# Patient Record
Sex: Female | Born: 1955 | Race: White | Hispanic: No | Smoking: Never smoker
Health system: Southern US, Community
[De-identification: ages and names within clinical notes are randomized; demographics above are authoritative.]

## PROBLEM LIST (undated history)

## (undated) DIAGNOSIS — K859 Acute pancreatitis without necrosis or infection, unspecified: Secondary | ICD-10-CM

## (undated) DIAGNOSIS — K589 Irritable bowel syndrome without diarrhea: Secondary | ICD-10-CM

## (undated) HISTORY — PX: TUBAL LIGATION: SHX77

## (undated) HISTORY — DX: Irritable bowel syndrome without diarrhea: K58.9

## (undated) HISTORY — DX: Acute pancreatitis without necrosis or infection, unspecified: K85.90

---

## 2003-02-07 ENCOUNTER — Ambulatory Visit (HOSPITAL_COMMUNITY): Admission: RE | Admit: 2003-02-07 | Discharge: 2003-02-07 | Payer: Self-pay | Admitting: Obstetrics & Gynecology

## 2007-08-02 ENCOUNTER — Other Ambulatory Visit: Admission: RE | Admit: 2007-08-02 | Discharge: 2007-08-02 | Payer: Self-pay | Admitting: Obstetrics & Gynecology

## 2007-08-03 ENCOUNTER — Ambulatory Visit (HOSPITAL_COMMUNITY): Admission: RE | Admit: 2007-08-03 | Discharge: 2007-08-03 | Payer: Self-pay | Admitting: Obstetrics & Gynecology

## 2010-11-27 ENCOUNTER — Other Ambulatory Visit (HOSPITAL_COMMUNITY)
Admission: RE | Admit: 2010-11-27 | Discharge: 2010-11-27 | Disposition: A | Discharge status: Home / Self Care | Payer: PRIVATE HEALTH INSURANCE | Source: Ambulatory Visit | Attending: Obstetrics & Gynecology | Admitting: Obstetrics & Gynecology

## 2010-11-27 DIAGNOSIS — Z01419 Encounter for gynecological examination (general) (routine) without abnormal findings: Secondary | ICD-10-CM | POA: Insufficient documentation

## 2010-11-28 ENCOUNTER — Other Ambulatory Visit: Payer: Self-pay | Admitting: Obstetrics & Gynecology

## 2010-11-28 DIAGNOSIS — Z139 Encounter for screening, unspecified: Secondary | ICD-10-CM

## 2010-12-05 ENCOUNTER — Ambulatory Visit (HOSPITAL_COMMUNITY)
Admission: RE | Admit: 2010-12-05 | Discharge: 2010-12-05 | Disposition: A | Discharge status: Home / Self Care | Payer: PRIVATE HEALTH INSURANCE | Source: Ambulatory Visit | Attending: Obstetrics & Gynecology | Admitting: Obstetrics & Gynecology

## 2010-12-05 DIAGNOSIS — Z1231 Encounter for screening mammogram for malignant neoplasm of breast: Secondary | ICD-10-CM | POA: Insufficient documentation

## 2010-12-05 DIAGNOSIS — Z139 Encounter for screening, unspecified: Secondary | ICD-10-CM

## 2011-12-02 ENCOUNTER — Other Ambulatory Visit (HOSPITAL_COMMUNITY)
Admission: RE | Admit: 2011-12-02 | Discharge: 2011-12-02 | Disposition: A | Discharge status: Home / Self Care | Payer: PRIVATE HEALTH INSURANCE | Source: Ambulatory Visit | Attending: Obstetrics & Gynecology | Admitting: Obstetrics & Gynecology

## 2011-12-02 DIAGNOSIS — Z01419 Encounter for gynecological examination (general) (routine) without abnormal findings: Secondary | ICD-10-CM | POA: Insufficient documentation

## 2013-10-23 ENCOUNTER — Other Ambulatory Visit: Payer: Self-pay | Admitting: Obstetrics & Gynecology

## 2013-10-25 ENCOUNTER — Encounter: Payer: Self-pay | Admitting: Obstetrics & Gynecology

## 2013-10-25 ENCOUNTER — Other Ambulatory Visit (HOSPITAL_COMMUNITY)
Admission: RE | Admit: 2013-10-25 | Discharge: 2013-10-25 | Disposition: A | Discharge status: Home / Self Care | Payer: BC Managed Care – PPO | Source: Ambulatory Visit | Attending: Obstetrics & Gynecology | Admitting: Obstetrics & Gynecology

## 2013-10-25 ENCOUNTER — Ambulatory Visit (INDEPENDENT_AMBULATORY_CARE_PROVIDER_SITE_OTHER): Payer: BC Managed Care – PPO | Admitting: Obstetrics & Gynecology

## 2013-10-25 VITALS — BP 122/80 | Ht 64.0 in | Wt 126.4 lb

## 2013-10-25 DIAGNOSIS — Z01419 Encounter for gynecological examination (general) (routine) without abnormal findings: Secondary | ICD-10-CM | POA: Diagnosis present

## 2013-10-25 DIAGNOSIS — Z1212 Encounter for screening for malignant neoplasm of rectum: Secondary | ICD-10-CM

## 2013-10-25 DIAGNOSIS — R87810 Cervical high risk human papillomavirus (HPV) DNA test positive: Secondary | ICD-10-CM | POA: Diagnosis present

## 2013-10-25 DIAGNOSIS — Z1151 Encounter for screening for human papillomavirus (HPV): Secondary | ICD-10-CM | POA: Diagnosis present

## 2013-10-25 NOTE — Progress Notes (Signed)
Patient ID: Marilyn Blackwell, female   DOB: [birth year], 59 y.o.   MRN: [id number] Subjective:     Marilyn Blackwell is a 58 y.o. female here for a routine exam.  No LMP recorded. Patient is not currently having periods (Reason: Perimenopausal). No obstetric history on file. Birth Control Method:  na Menstrual Calendar(currently): post menopausal  Current complaints: 2 moles.   Current acute medical issues:  none   Recent Gynecologic History No LMP recorded. Patient is not currently having periods (Reason: Perimenopausal). Last Pap: 2014,  normal Last mammogram: 2012,  normal  Past Medical History  Diagnosis Date  . IBS (irritable bowel syndrome)     History reviewed. No pertinent past surgical history.  OB History   Grav Para Term Preterm Abortions TAB SAB Ect Mult Living                  History   Social History  . Marital Status: Legally Separated    Spouse Name: N/A    Number of Children: N/A  . Years of Education: N/A   Social History Main Topics  . Smoking status: Never Smoker   . Smokeless tobacco: None  . Alcohol Use: None  . Drug Use: None  . Sexual Activity: None   Other Topics Concern  . None   Social History Narrative  . None    Family History  Problem Relation Age of Onset  . Heart Problems Mother   . Lupus Mother   . Osteoporosis Mother   . Arthritis Mother      Review of Systems  Review of Systems  Constitutional: Negative for fever, chills, weight loss, malaise/fatigue and diaphoresis.  HENT: Negative for hearing loss, ear pain, nosebleeds, congestion, sore throat, neck pain, tinnitus and ear discharge.   Eyes: Negative for blurred vision, double vision, photophobia, pain, discharge and redness.  Respiratory: Negative for cough, hemoptysis, sputum production, shortness of breath, wheezing and stridor.   Cardiovascular: Negative for chest pain, palpitations, orthopnea, claudication, leg swelling and PND.  Gastrointestinal: negative for  abdominal pain. Negative for heartburn, nausea, vomiting, diarrhea, constipation, blood in stool and melena.  Genitourinary: Negative for dysuria, urgency, frequency, hematuria and flank pain.  Musculoskeletal: Negative for myalgias, back pain, joint pain and falls.  Skin: Negative for itching and rash.  Neurological: Negative for dizziness, tingling, tremors, sensory change, speech change, focal weakness, seizures, loss of consciousness, weakness and headaches.  Endo/Heme/Allergies: Negative for environmental allergies and polydipsia. Does not bruise/bleed easily.  Psychiatric/Behavioral: Negative for depression, suicidal ideas, hallucinations, memory loss and substance abuse. The patient is not nervous/anxious and does not have insomnia.        Objective:    Physical Exam  Vitals reviewed. Constitutional: She is oriented to person, place, and time. She appears well-developed and well-nourished.  HENT:  Head: Normocephalic and atraumatic.        Right Ear: External ear normal.  Left Ear: External ear normal.  Nose: Nose normal.  Mouth/Throat: Oropharynx is clear and moist.  Eyes: Conjunctivae and EOM are normal. Pupils are equal, round, and reactive to light. Right eye exhibits no discharge. Left eye exhibits no discharge. No scleral icterus.  Neck: Normal range of motion. Neck supple. No tracheal deviation present. No thyromegaly present.  Cardiovascular: Normal rate, regular rhythm, normal heart sounds and intact distal pulses.  Exam reveals no gallop and no friction rub.   No murmur heard. Respiratory: Effort normal and breath sounds normal. No respiratory distress. She has no  wheezes. She has no rales. She exhibits no tenderness.  GI: Soft. Bowel sounds are normal. She exhibits no distension and no mass. There is no tenderness. There is no rebound and no guarding.  Genitourinary:  Breasts no masses skin changes or nipple changes bilaterally      Vulva is normal without  lesions Vagina is pink moist without discharge Cervix normal in appearance and pap is done Uterus is normal size shape and contour Adnexa is negative with normal sized ovaries  Rectal    hemoccult negative, normal tone, no masses  Musculoskeletal: Normal range of motion. She exhibits no edema and no tenderness.  Neurological: She is alert and oriented to person, place, and time. She has normal reflexes. She displays normal reflexes. No cranial nerve deficit. She exhibits normal muscle tone. Coordination normal.  Skin: Skin is warm and dry. No rash noted. No erythema. No pallor.  Psychiatric: She has a normal mood and affect. Her behavior is normal. Judgment and thought content normal.       Assessment:    Healthy female exam.    Plan:    Follow up in: 1 year.   Mammogram

## 2013-10-27 LAB — CYTOLOGY - PAP

## 2013-11-08 ENCOUNTER — Ambulatory Visit (INDEPENDENT_AMBULATORY_CARE_PROVIDER_SITE_OTHER): Payer: BC Managed Care – PPO | Admitting: Obstetrics & Gynecology

## 2013-11-08 ENCOUNTER — Encounter: Payer: Self-pay | Admitting: Obstetrics & Gynecology

## 2013-11-08 VITALS — BP 122/82 | Ht 64.0 in | Wt 128.0 lb

## 2013-11-08 DIAGNOSIS — D229 Melanocytic nevi, unspecified: Secondary | ICD-10-CM

## 2013-11-08 DIAGNOSIS — L909 Atrophic disorder of skin, unspecified: Secondary | ICD-10-CM

## 2013-11-08 DIAGNOSIS — L919 Hypertrophic disorder of the skin, unspecified: Secondary | ICD-10-CM

## 2013-11-08 DIAGNOSIS — D235 Other benign neoplasm of skin of trunk: Secondary | ICD-10-CM

## 2013-11-08 NOTE — Progress Notes (Signed)
Patient ID: Marilyn Blackwell, female   DOB: July 20, 1955, 58 y.o.   MRN: [id number] Patient presents with 2 small skin lesions, one as a skin tag and the other is a small mole.    The skin tag is just under the left breast The area is prepped One percent lidocaine is injected A scalpel was used to The lesion was taken off without difficulty Small amount of silver nitrate was used for hemostasis  The second lesion was in the left abdominal wall It's a small mole The area is prepped One percent lidocaine injected Scalpel was used and is removed as a superficial shave also Silver nitrate is used for hemostasis  Both areas were hemostatic  Both areas are bandage  Followup as needed

## 2013-11-16 ENCOUNTER — Ambulatory Visit (INDEPENDENT_AMBULATORY_CARE_PROVIDER_SITE_OTHER): Payer: BC Managed Care – PPO | Admitting: Obstetrics & Gynecology

## 2013-11-16 ENCOUNTER — Encounter: Payer: Self-pay | Admitting: Obstetrics & Gynecology

## 2013-11-16 VITALS — BP 140/90 | Wt 129.0 lb

## 2013-11-16 DIAGNOSIS — D229 Melanocytic nevi, unspecified: Secondary | ICD-10-CM

## 2013-11-16 DIAGNOSIS — Z9889 Other specified postprocedural states: Secondary | ICD-10-CM

## 2013-11-16 MED ORDER — SILVER SULFADIAZINE 1 % EX CREA: 1.0000 "application " | TOPICAL_CREAM | Freq: Every day | CUTANEOUS | Status: DC

## 2013-11-16 NOTE — Progress Notes (Signed)
Patient ID: Marilyn Blackwell, female   DOB: [birth year], 58 y.o.   MRN: [id number] Had 2 lesions removed last week  Wants them rechecked  Exam Dark area from silver nitrate No infection  Use silvadene tid Follow up prn

## 2014-09-24 ENCOUNTER — Telehealth: Payer: Self-pay | Admitting: *Deleted

## 2014-09-24 NOTE — Telephone Encounter (Signed)
Pt informed after reviewing pt chart has not had yearly mammogram since 10/30/ 2012. Pt reminded of the importance of yearly mammogram and pt is able to schedule screening mammogram for herself. Pt also reminded pap due after 10/26/2014. Pt verbalized understanding.

## 2014-10-02 ENCOUNTER — Other Ambulatory Visit: Payer: Self-pay | Admitting: Obstetrics & Gynecology

## 2014-10-02 DIAGNOSIS — Z1231 Encounter for screening mammogram for malignant neoplasm of breast: Secondary | ICD-10-CM

## 2014-10-22 ENCOUNTER — Ambulatory Visit (HOSPITAL_COMMUNITY)
Admission: RE | Admit: 2014-10-22 | Discharge: 2014-10-22 | Disposition: A | Discharge status: Home / Self Care | Payer: BLUE CROSS/BLUE SHIELD | Source: Ambulatory Visit | Attending: Obstetrics & Gynecology | Admitting: Obstetrics & Gynecology

## 2014-10-22 DIAGNOSIS — Z1231 Encounter for screening mammogram for malignant neoplasm of breast: Secondary | ICD-10-CM | POA: Diagnosis not present

## 2016-10-05 ENCOUNTER — Other Ambulatory Visit: Payer: Self-pay | Admitting: Obstetrics & Gynecology

## 2016-10-05 DIAGNOSIS — Z1231 Encounter for screening mammogram for malignant neoplasm of breast: Secondary | ICD-10-CM

## 2016-10-07 ENCOUNTER — Ambulatory Visit (HOSPITAL_COMMUNITY)
Admission: RE | Admit: 2016-10-07 | Discharge: 2016-10-07 | Disposition: A | Discharge status: Home / Self Care | Payer: BLUE CROSS/BLUE SHIELD | Source: Ambulatory Visit | Attending: Obstetrics & Gynecology | Admitting: Obstetrics & Gynecology

## 2016-10-07 DIAGNOSIS — Z1231 Encounter for screening mammogram for malignant neoplasm of breast: Secondary | ICD-10-CM

## 2016-10-20 ENCOUNTER — Other Ambulatory Visit (HOSPITAL_COMMUNITY)
Admission: RE | Admit: 2016-10-20 | Discharge: 2016-10-20 | Disposition: A | Discharge status: Home / Self Care | Payer: BLUE CROSS/BLUE SHIELD | Source: Ambulatory Visit | Attending: Obstetrics & Gynecology | Admitting: Obstetrics & Gynecology

## 2016-10-20 ENCOUNTER — Ambulatory Visit (INDEPENDENT_AMBULATORY_CARE_PROVIDER_SITE_OTHER): Payer: BLUE CROSS/BLUE SHIELD | Admitting: Obstetrics & Gynecology

## 2016-10-20 ENCOUNTER — Encounter: Payer: Self-pay | Admitting: Obstetrics & Gynecology

## 2016-10-20 VITALS — BP 112/74 | HR 73 | Ht 63.0 in | Wt 142.0 lb

## 2016-10-20 DIAGNOSIS — Z1212 Encounter for screening for malignant neoplasm of rectum: Secondary | ICD-10-CM | POA: Diagnosis not present

## 2016-10-20 DIAGNOSIS — Z01419 Encounter for gynecological examination (general) (routine) without abnormal findings: Secondary | ICD-10-CM | POA: Diagnosis not present

## 2016-10-20 DIAGNOSIS — Z1211 Encounter for screening for malignant neoplasm of colon: Secondary | ICD-10-CM

## 2016-10-20 NOTE — Progress Notes (Signed)
Subjective:     Marilyn Blackwell is a 61 y.o. female here for a routine exam.  No LMP recorded. Patient is postmenopausal. G2P2 Birth Control Method:  menopausal Menstrual Calendar(currently): amenorrheic  Current complaints: none.   Current acute medical issues:  none   Recent Gynecologic History No LMP recorded. Patient is postmenopausal. Last Pap: 2017,  normal Last mammogram: 10/06/2016,  normal  Past Medical History:  Diagnosis Date  . IBS (irritable bowel syndrome)     Past Surgical History:  Procedure Laterality Date  . CESAREAN SECTION    . TUBAL LIGATION      OB History    Gravida Para Term Preterm AB Living   2 2       2    SAB TAB Ectopic Multiple Live Births                  Social History   Social History  . Marital status: Legally Separated    Spouse name: N/A  . Number of children: N/A  . Years of education: N/A   Social History Main Topics  . Smoking status: Never Smoker  . Smokeless tobacco: Never Used  . Alcohol use No  . Drug use: No  . Sexual activity: Yes   Other Topics Concern  . None   Social History Narrative  . None    Family History  Problem Relation Age of Onset  . Heart Problems Mother   . Lupus Mother   . Osteoporosis Mother   . Arthritis Mother      Current Outpatient Prescriptions:  .  aspirin 81 MG tablet, Take 81 mg by mouth daily., Disp: , Rfl:  .  Cholecalciferol (VITAMIN D3) 5000 units TABS, Take by mouth., Disp: , Rfl:  .  Turmeric Curcumin 500 MG CAPS, Take by mouth., Disp: , Rfl:  .  vitamin B-12 (CYANOCOBALAMIN) 100 MCG tablet, Take 100 mcg by mouth daily., Disp: , Rfl:   Review of Systems  Review of Systems  Constitutional: Negative for fever, chills, weight loss, malaise/fatigue and diaphoresis.  HENT: Negative for hearing loss, ear pain, nosebleeds, congestion, sore throat, neck pain, tinnitus and ear discharge.   Eyes: Negative for blurred vision, double vision, photophobia, pain, discharge and  redness.  Respiratory: Negative for cough, hemoptysis, sputum production, shortness of breath, wheezing and stridor.   Cardiovascular: Negative for chest pain, palpitations, orthopnea, claudication, leg swelling and PND.  Gastrointestinal: negative for abdominal pain. Negative for heartburn, nausea, vomiting, diarrhea, constipation, blood in stool and melena.  Genitourinary: Negative for dysuria, urgency, frequency, hematuria and flank pain.  Musculoskeletal: Negative for myalgias, back pain, joint pain and falls.  Skin: Negative for itching and rash.  Neurological: Negative for dizziness, tingling, tremors, sensory change, speech change, focal weakness, seizures, loss of consciousness, weakness and headaches.  Endo/Heme/Allergies: Negative for environmental allergies and polydipsia. Does not bruise/bleed easily.  Psychiatric/Behavioral: Negative for depression, suicidal ideas, hallucinations, memory loss and substance abuse. The patient is not nervous/anxious and does not have insomnia.        Objective:  Blood pressure 112/74, pulse 73, height 5\' 3"  (1.6 m), weight 142 lb (64.4 kg).   Physical Exam  Vitals reviewed. Constitutional: She is oriented to person, place, and time. She appears well-developed and well-nourished.  HENT:  Head: Normocephalic and atraumatic.        Right Ear: External ear normal.  Left Ear: External ear normal.  Nose: Nose normal.  Mouth/Throat: Oropharynx is clear and moist.  Eyes:  Conjunctivae and EOM are normal. Pupils are equal, round, and reactive to light. Right eye exhibits no discharge. Left eye exhibits no discharge. No scleral icterus.  Neck: Normal range of motion. Neck supple. No tracheal deviation present. No thyromegaly present.  Cardiovascular: Normal rate, regular rhythm, normal heart sounds and intact distal pulses.  Exam reveals no gallop and no friction rub.   No murmur heard. Respiratory: Effort normal and breath sounds normal. No respiratory  distress. She has no wheezes. She has no rales. She exhibits no tenderness.  GI: Soft. Bowel sounds are normal. She exhibits no distension and no mass. There is no tenderness. There is no rebound and no guarding.  Genitourinary:  Breasts no masses skin changes or nipple changes bilaterally      Vulva is normal without lesions Vagina is pink moist without discharge Cervix normal in appearance and pap is done Uterus is normal size shape and contour Adnexa is negative with normal sized ovaries  {Rectal    hemoccult negative, normal tone, no masses  Musculoskeletal: Normal range of motion. She exhibits no edema and no tenderness.  Neurological: She is alert and oriented to person, place, and time. She has normal reflexes. She displays normal reflexes. No cranial nerve deficit. She exhibits normal muscle tone. Coordination normal.  Skin: Skin is warm and dry. No rash noted. No erythema. No pallor.  Psychiatric: She has a normal mood and affect. Her behavior is normal. Judgment and thought content normal.       Medications Ordered at today's visit: Meds ordered this encounter  Medications  . Cholecalciferol (VITAMIN D3) 5000 units TABS    Sig: Take by mouth.  . vitamin B-12 (CYANOCOBALAMIN) 100 MCG tablet    Sig: Take 100 mcg by mouth daily.  . Turmeric Curcumin 500 MG CAPS    Sig: Take by mouth.    Other orders placed at today's visit: No orders of the defined types were placed in this encounter.     Assessment:    Healthy female exam.    Plan:    Mammogram ordered. Follow up in: 3 years.     Return in about 3 years (around 10/21/2019) for Follow up, with Dr Elonda Husky, yearly.

## 2016-10-22 LAB — CYTOLOGY - PAP
Diagnosis: NEGATIVE
HPV: NOT DETECTED

## 2018-03-11 HISTORY — PX: CHOLECYSTECTOMY: SHX55

## 2019-09-05 ENCOUNTER — Encounter: Payer: Self-pay | Admitting: Obstetrics & Gynecology

## 2019-09-05 ENCOUNTER — Other Ambulatory Visit (HOSPITAL_COMMUNITY)
Admission: RE | Admit: 2019-09-05 | Discharge: 2019-09-05 | Disposition: A | Discharge status: Home / Self Care | Payer: Managed Care, Other (non HMO) | Source: Ambulatory Visit | Attending: Obstetrics & Gynecology | Admitting: Obstetrics & Gynecology

## 2019-09-05 ENCOUNTER — Other Ambulatory Visit: Payer: Self-pay

## 2019-09-05 ENCOUNTER — Ambulatory Visit: Payer: Managed Care, Other (non HMO) | Admitting: Obstetrics & Gynecology

## 2019-09-05 ENCOUNTER — Encounter: Payer: Managed Care, Other (non HMO) | Admitting: Obstetrics & Gynecology

## 2019-09-05 VITALS — BP 141/86 | HR 65 | Ht 63.0 in | Wt 146.0 lb

## 2019-09-05 DIAGNOSIS — Z1211 Encounter for screening for malignant neoplasm of colon: Secondary | ICD-10-CM | POA: Diagnosis not present

## 2019-09-05 DIAGNOSIS — Z01419 Encounter for gynecological examination (general) (routine) without abnormal findings: Secondary | ICD-10-CM | POA: Insufficient documentation

## 2019-09-05 DIAGNOSIS — Z1212 Encounter for screening for malignant neoplasm of rectum: Secondary | ICD-10-CM

## 2019-09-05 NOTE — Progress Notes (Signed)
Subjective:     Marilyn Blackwell is a 64 y.o. female here for a routine exam.  No LMP recorded. Patient is postmenopausal. G2P2 Birth Control Method:  menoapusal Menstrual Calendar(currently): amenorrhea  Current complaints: constipation.   Current acute medical issues:  IBS   Recent Gynecologic History No LMP recorded. Patient is postmenopausal. Last Pap: 2018,  normal Last mammogram: 2018,  normal  Past Medical History:  Diagnosis Date  . IBS (irritable bowel syndrome)   . Pancreatitis     Past Surgical History:  Procedure Laterality Date  . CESAREAN SECTION    . CHOLECYSTECTOMY  03/11/2018  . TUBAL LIGATION      OB History    Gravida  2   Para  2   Term      Preterm      AB      Living  2     SAB      TAB      Ectopic      Multiple      Live Births              Social History   Socioeconomic History  . Marital status: Legally Separated    Spouse name: Not on file  . Number of children: Not on file  . Years of education: Not on file  . Highest education level: Not on file  Occupational History  . Not on file  Tobacco Use  . Smoking status: Never Smoker  . Smokeless tobacco: Never Used  Vaping Use  . Vaping Use: Never used  Substance and Sexual Activity  . Alcohol use: No  . Drug use: No  . Sexual activity: Yes  Other Topics Concern  . Not on file  Social History Narrative  . Not on file   Social Determinants of Health   Financial Resource Strain:   . Difficulty of Paying Living Expenses:   Food Insecurity:   . Worried About Charity fundraiser in the Last Year:   . Arboriculturist in the Last Year:   Transportation Needs:   . Film/video editor (Medical):   Marland Kitchen Lack of Transportation (Non-Medical):   Physical Activity:   . Days of Exercise per Week:   . Minutes of Exercise per Session:   Stress:   . Feeling of Stress :   Social Connections:   . Frequency of Communication with Friends and Family:   . Frequency of  Social Gatherings with Friends and Family:   . Attends Religious Services:   . Active Member of Clubs or Organizations:   . Attends Archivist Meetings:   Marland Kitchen Marital Status:     Family History  Problem Relation Age of Onset  . Heart Problems Mother   . Lupus Mother   . Osteoporosis Mother   . Arthritis Mother      Current Outpatient Medications:  .  aspirin 81 MG tablet, Take 81 mg by mouth daily., Disp: , Rfl:  .  Cholecalciferol (VITAMIN D3) 5000 units TABS, Take by mouth., Disp: , Rfl:  .  omeprazole (PRILOSEC) 20 MG capsule, Take by mouth., Disp: , Rfl:  .  senna-docusate (SENOKOT-S) 8.6-50 MG tablet, TAKE 2 TABLETS BY MOUTH DAILY TO PREVENT CONSTIPATION, Disp: , Rfl:  .  Turmeric Curcumin 500 MG CAPS, Take by mouth., Disp: , Rfl:  .  vitamin B-12 (CYANOCOBALAMIN) 100 MCG tablet, Take 100 mcg by mouth daily., Disp: , Rfl:   Review of Systems  Review of Systems  Constitutional: Negative for fever, chills, weight loss, malaise/fatigue and diaphoresis.  HENT: Negative for hearing loss, ear pain, nosebleeds, congestion, sore throat, neck pain, tinnitus and ear discharge.   Eyes: Negative for blurred vision, double vision, photophobia, pain, discharge and redness.  Respiratory: Negative for cough, hemoptysis, sputum production, shortness of breath, wheezing and stridor.   Cardiovascular: Negative for chest pain, palpitations, orthopnea, claudication, leg swelling and PND.  Gastrointestinal: negative for abdominal pain. Negative for heartburn, nausea, vomiting, diarrhea, constipation, blood in stool and melena.  Genitourinary: Negative for dysuria, urgency, frequency, hematuria and flank pain.  Musculoskeletal: Negative for myalgias, back pain, joint pain and falls.  Skin: Negative for itching and rash.  Neurological: Negative for dizziness, tingling, tremors, sensory change, speech change, focal weakness, seizures, loss of consciousness, weakness and headaches.   Endo/Heme/Allergies: Negative for environmental allergies and polydipsia. Does not bruise/bleed easily.  Psychiatric/Behavioral: Negative for depression, suicidal ideas, hallucinations, memory loss and substance abuse. The patient is not nervous/anxious and does not have insomnia.        Objective:  Blood pressure (!) 141/86, pulse 65, height 5\' 3"  (1.6 m), weight 146 lb (66.2 kg).   Physical Exam  Vitals reviewed. Constitutional: She is oriented to person, place, and time. She appears well-developed and well-nourished.  HENT:  Head: Normocephalic and atraumatic.        Right Ear: External ear normal.  Left Ear: External ear normal.  Nose: Nose normal.  Mouth/Throat: Oropharynx is clear and moist.  Eyes: Conjunctivae and EOM are normal. Pupils are equal, round, and reactive to light. Right eye exhibits no discharge. Left eye exhibits no discharge. No scleral icterus.  Neck: Normal range of motion. Neck supple. No tracheal deviation present. No thyromegaly present.  Cardiovascular: Normal rate, regular rhythm, normal heart sounds and intact distal pulses.  Exam reveals no gallop and no friction rub.   No murmur heard. Respiratory: Effort normal and breath sounds normal. No respiratory distress. She has no wheezes. She has no rales. She exhibits no tenderness.  GI: Soft. Bowel sounds are normal. She exhibits no distension and no mass. There is no tenderness. There is no rebound and no guarding.  Genitourinary:  Breasts no masses skin changes or nipple changes bilaterally      Vulva is normal without lesions Vagina is pink moist without discharge Cervix normal in appearance and pap is done Uterus is normal size shape and contour Adnexa is negative with normal sized ovaries  {Rectal    hemoccult negative, normal tone, no masses  Musculoskeletal: Normal range of motion. She exhibits no edema and no tenderness.  Neurological: She is alert and oriented to person, place, and time. She has  normal reflexes. She displays normal reflexes. No cranial nerve deficit. She exhibits normal muscle tone. Coordination normal.  Skin: Skin is warm and dry. No rash noted. No erythema. No pallor.  Psychiatric: She has a normal mood and affect. Her behavior is normal. Judgment and thought content normal.       Medications Ordered at today's visit: No orders of the defined types were placed in this encounter.   Other orders placed at today's visit: No orders of the defined types were placed in this encounter.     Assessment:    Normal Gyn exam.    Plan:    Mammogram ordered. Follow up in: 3 years.   Recommend [facility] once daily ultra  Return in about 3 years (around 09/05/2022) for yearly, with  Dr Elonda Husky.

## 2019-09-07 LAB — CYTOLOGY - PAP
Comment: NEGATIVE
Diagnosis: NEGATIVE
High risk HPV: NEGATIVE

## 2019-09-11 ENCOUNTER — Other Ambulatory Visit (HOSPITAL_COMMUNITY): Payer: Self-pay | Admitting: Obstetrics & Gynecology

## 2019-09-11 DIAGNOSIS — Z1231 Encounter for screening mammogram for malignant neoplasm of breast: Secondary | ICD-10-CM

## 2019-09-18 ENCOUNTER — Ambulatory Visit (HOSPITAL_COMMUNITY)
Admission: RE | Admit: 2019-09-18 | Discharge: 2019-09-18 | Disposition: A | Discharge status: Home / Self Care | Payer: Managed Care, Other (non HMO) | Source: Ambulatory Visit | Attending: Obstetrics & Gynecology | Admitting: Obstetrics & Gynecology

## 2019-09-18 ENCOUNTER — Other Ambulatory Visit: Payer: Self-pay

## 2019-09-18 DIAGNOSIS — Z1231 Encounter for screening mammogram for malignant neoplasm of breast: Secondary | ICD-10-CM | POA: Diagnosis not present

## 2020-02-11 NOTE — Progress Notes (Signed)
This encounter was created in error - please disregard.

## 2020-05-07 DIAGNOSIS — Z780 Asymptomatic menopausal state: Secondary | ICD-10-CM | POA: Diagnosis not present

## 2020-05-07 DIAGNOSIS — M85852 Other specified disorders of bone density and structure, left thigh: Secondary | ICD-10-CM | POA: Diagnosis not present

## 2020-05-07 DIAGNOSIS — M810 Age-related osteoporosis without current pathological fracture: Secondary | ICD-10-CM | POA: Diagnosis not present

## 2020-11-29 ENCOUNTER — Other Ambulatory Visit (HOSPITAL_COMMUNITY): Payer: Self-pay | Admitting: Obstetrics & Gynecology

## 2020-11-29 DIAGNOSIS — Z1231 Encounter for screening mammogram for malignant neoplasm of breast: Secondary | ICD-10-CM

## 2020-12-13 ENCOUNTER — Ambulatory Visit (HOSPITAL_COMMUNITY)
Admission: RE | Admit: 2020-12-13 | Discharge: 2020-12-13 | Disposition: A | Discharge status: Home / Self Care | Payer: Medicare Other | Source: Ambulatory Visit | Attending: Obstetrics & Gynecology | Admitting: Obstetrics & Gynecology

## 2020-12-13 ENCOUNTER — Other Ambulatory Visit: Payer: Self-pay

## 2020-12-13 DIAGNOSIS — Z1231 Encounter for screening mammogram for malignant neoplasm of breast: Secondary | ICD-10-CM | POA: Diagnosis not present

## 2020-12-23 DIAGNOSIS — Z13220 Encounter for screening for lipoid disorders: Secondary | ICD-10-CM | POA: Diagnosis not present

## 2020-12-23 DIAGNOSIS — Z0001 Encounter for general adult medical examination with abnormal findings: Secondary | ICD-10-CM | POA: Diagnosis not present

## 2020-12-23 DIAGNOSIS — K219 Gastro-esophageal reflux disease without esophagitis: Secondary | ICD-10-CM | POA: Diagnosis not present

## 2020-12-23 DIAGNOSIS — R748 Abnormal levels of other serum enzymes: Secondary | ICD-10-CM | POA: Diagnosis not present

## 2020-12-23 DIAGNOSIS — Z131 Encounter for screening for diabetes mellitus: Secondary | ICD-10-CM | POA: Diagnosis not present

## 2020-12-23 DIAGNOSIS — E785 Hyperlipidemia, unspecified: Secondary | ICD-10-CM | POA: Diagnosis not present

## 2020-12-23 DIAGNOSIS — Z1329 Encounter for screening for other suspected endocrine disorder: Secondary | ICD-10-CM | POA: Diagnosis not present

## 2020-12-23 DIAGNOSIS — R739 Hyperglycemia, unspecified: Secondary | ICD-10-CM | POA: Diagnosis not present

## 2020-12-27 DIAGNOSIS — I10 Essential (primary) hypertension: Secondary | ICD-10-CM | POA: Diagnosis not present

## 2020-12-27 DIAGNOSIS — E785 Hyperlipidemia, unspecified: Secondary | ICD-10-CM | POA: Diagnosis not present

## 2020-12-27 DIAGNOSIS — K219 Gastro-esophageal reflux disease without esophagitis: Secondary | ICD-10-CM | POA: Diagnosis not present

## 2020-12-27 DIAGNOSIS — Z1211 Encounter for screening for malignant neoplasm of colon: Secondary | ICD-10-CM | POA: Diagnosis not present

## 2021-01-20 DIAGNOSIS — Z1211 Encounter for screening for malignant neoplasm of colon: Secondary | ICD-10-CM | POA: Diagnosis not present

## 2021-01-20 DIAGNOSIS — Z1212 Encounter for screening for malignant neoplasm of rectum: Secondary | ICD-10-CM | POA: Diagnosis not present

## 2021-01-25 LAB — COLOGUARD: COLOGUARD: NEGATIVE

## 2021-09-01 DIAGNOSIS — R748 Abnormal levels of other serum enzymes: Secondary | ICD-10-CM | POA: Diagnosis not present

## 2021-09-01 DIAGNOSIS — I10 Essential (primary) hypertension: Secondary | ICD-10-CM | POA: Diagnosis not present

## 2021-09-01 DIAGNOSIS — K219 Gastro-esophageal reflux disease without esophagitis: Secondary | ICD-10-CM | POA: Diagnosis not present

## 2021-09-01 DIAGNOSIS — E785 Hyperlipidemia, unspecified: Secondary | ICD-10-CM | POA: Diagnosis not present

## 2021-09-22 DIAGNOSIS — K219 Gastro-esophageal reflux disease without esophagitis: Secondary | ICD-10-CM | POA: Diagnosis not present

## 2021-09-22 DIAGNOSIS — Z6822 Body mass index (BMI) 22.0-22.9, adult: Secondary | ICD-10-CM | POA: Diagnosis not present

## 2021-09-22 DIAGNOSIS — Z1389 Encounter for screening for other disorder: Secondary | ICD-10-CM | POA: Diagnosis not present

## 2021-09-22 DIAGNOSIS — I10 Essential (primary) hypertension: Secondary | ICD-10-CM | POA: Diagnosis not present

## 2021-09-22 DIAGNOSIS — E785 Hyperlipidemia, unspecified: Secondary | ICD-10-CM | POA: Diagnosis not present

## 2022-01-05 DIAGNOSIS — I10 Essential (primary) hypertension: Secondary | ICD-10-CM | POA: Diagnosis not present

## 2022-01-05 DIAGNOSIS — E785 Hyperlipidemia, unspecified: Secondary | ICD-10-CM | POA: Diagnosis not present

## 2022-01-05 DIAGNOSIS — K219 Gastro-esophageal reflux disease without esophagitis: Secondary | ICD-10-CM | POA: Diagnosis not present

## 2022-01-05 DIAGNOSIS — Z1231 Encounter for screening mammogram for malignant neoplasm of breast: Secondary | ICD-10-CM | POA: Diagnosis not present

## 2022-01-09 DIAGNOSIS — Z6822 Body mass index (BMI) 22.0-22.9, adult: Secondary | ICD-10-CM | POA: Diagnosis not present

## 2022-01-09 DIAGNOSIS — Z23 Encounter for immunization: Secondary | ICD-10-CM | POA: Diagnosis not present

## 2022-01-09 DIAGNOSIS — I10 Essential (primary) hypertension: Secondary | ICD-10-CM | POA: Diagnosis not present

## 2022-01-09 DIAGNOSIS — E785 Hyperlipidemia, unspecified: Secondary | ICD-10-CM | POA: Diagnosis not present

## 2022-01-09 DIAGNOSIS — Z0001 Encounter for general adult medical examination with abnormal findings: Secondary | ICD-10-CM | POA: Diagnosis not present

## 2022-05-11 DIAGNOSIS — H5203 Hypermetropia, bilateral: Secondary | ICD-10-CM | POA: Diagnosis not present

## 2022-09-24 DIAGNOSIS — I951 Orthostatic hypotension: Secondary | ICD-10-CM | POA: Diagnosis not present

## 2022-09-24 DIAGNOSIS — R5383 Other fatigue: Secondary | ICD-10-CM | POA: Diagnosis not present

## 2022-09-24 DIAGNOSIS — Z6822 Body mass index (BMI) 22.0-22.9, adult: Secondary | ICD-10-CM | POA: Diagnosis not present

## 2022-09-24 DIAGNOSIS — I10 Essential (primary) hypertension: Secondary | ICD-10-CM | POA: Diagnosis not present

## 2022-09-24 DIAGNOSIS — R42 Dizziness and giddiness: Secondary | ICD-10-CM | POA: Diagnosis not present

## 2022-09-24 DIAGNOSIS — Z1329 Encounter for screening for other suspected endocrine disorder: Secondary | ICD-10-CM | POA: Diagnosis not present

## 2022-10-26 ENCOUNTER — Ambulatory Visit: Payer: No Typology Code available for payment source | Admitting: Obstetrics & Gynecology

## 2022-10-26 ENCOUNTER — Encounter: Payer: Self-pay | Admitting: Obstetrics & Gynecology

## 2022-10-26 ENCOUNTER — Other Ambulatory Visit (HOSPITAL_COMMUNITY)
Admission: RE | Admit: 2022-10-26 | Discharge: 2022-10-26 | Disposition: A | Discharge status: Home / Self Care | Payer: No Typology Code available for payment source | Source: Ambulatory Visit | Attending: Obstetrics & Gynecology | Admitting: Obstetrics & Gynecology

## 2022-10-26 VITALS — BP 137/76 | HR 66 | Ht 64.0 in | Wt 131.0 lb

## 2022-10-26 DIAGNOSIS — Z1151 Encounter for screening for human papillomavirus (HPV): Secondary | ICD-10-CM | POA: Diagnosis not present

## 2022-10-26 DIAGNOSIS — Z1212 Encounter for screening for malignant neoplasm of rectum: Secondary | ICD-10-CM | POA: Diagnosis not present

## 2022-10-26 DIAGNOSIS — Z01419 Encounter for gynecological examination (general) (routine) without abnormal findings: Secondary | ICD-10-CM

## 2022-10-26 DIAGNOSIS — Z1211 Encounter for screening for malignant neoplasm of colon: Secondary | ICD-10-CM

## 2022-10-26 NOTE — Progress Notes (Signed)
Subjective:     Marilyn Blackwell is a 67 y.o. female here for a routine exam.  No LMP recorded. Patient is postmenopausal. G2P2 Birth Control Method:  menopausal Menstrual Calendar(currently): amenorrheic  Current complaints: none.   Current acute medical issues:  none   Recent Gynecologic History No LMP recorded. Patient is postmenopausal. Last Pap: 2021,  normal Last mammogram: 12/2020,  normal  Past Medical History:  Diagnosis Date   IBS (irritable bowel syndrome)    Pancreatitis     Past Surgical History:  Procedure Laterality Date   CESAREAN SECTION     CHOLECYSTECTOMY  03/11/2018   TUBAL LIGATION      OB History     Gravida  2   Para  2   Term      Preterm      AB      Living  2      SAB      IAB      Ectopic      Multiple      Live Births              Social History   Socioeconomic History   Marital status: Legally Separated    Spouse name: Not on file   Number of children: Not on file   Years of education: Not on file   Highest education level: Not on file  Occupational History   Not on file  Tobacco Use   Smoking status: Never   Smokeless tobacco: Never  Vaping Use   Vaping status: Never Used  Substance and Sexual Activity   Alcohol use: No   Drug use: No   Sexual activity: Yes  Other Topics Concern   Not on file  Social History Narrative   Not on file   Social Determinants of Health   Financial Resource Strain: Low Risk  (10/26/2022)   Overall Financial Resource Strain (CARDIA)    Difficulty of Paying Living Expenses: Not hard at all  Food Insecurity: No Food Insecurity (10/26/2022)   Hunger Vital Sign    Worried About Running Out of Food in the Last Year: Never true    Ran Out of Food in the Last Year: Never true  Transportation Needs: No Transportation Needs (10/26/2022)   PRAPARE - Administrator, Civil Service (Medical): No    Lack of Transportation (Non-Medical): No  Physical Activity: Sufficiently  Active (10/26/2022)   Exercise Vital Sign    Days of Exercise per Week: 3 days    Minutes of Exercise per Session: 60 min  Stress: No Stress Concern Present (10/26/2022)   [employer] of Occupational Health - Occupational Stress Questionnaire    Feeling of Stress : Only a little  Social Connections: Moderately Isolated (10/26/2022)   Social Connection and Isolation Panel [NHANES]    Frequency of Communication with Friends and Family: More than three times a week    Frequency of Social Gatherings with Friends and Family: More than three times a week    Attends Religious Services: 1 to 4 times per year    Active Member of [employer] or Organizations: No    Attends Banker Meetings: Never    Marital Status: Separated    Family History  Problem Relation Age of Onset   Heart Problems Mother    Lupus Mother    Osteoporosis Mother    Arthritis Mother      Current Outpatient Medications:    Cholecalciferol (VITAMIN D3) 5000  units TABS, Take by mouth., Disp: , Rfl:    senna-docusate (SENOKOT-S) 8.6-50 MG tablet, TAKE 2 TABLETS BY MOUTH DAILY TO PREVENT CONSTIPATION, Disp: , Rfl:    Turmeric Curcumin 500 MG CAPS, Take by mouth., Disp: , Rfl:    vitamin B-12 (CYANOCOBALAMIN) 100 MCG tablet, Take 100 mcg by mouth daily., Disp: , Rfl:    aspirin 81 MG tablet, Take 81 mg by mouth daily. (Patient not taking: Reported on 10/26/2022), Disp: , Rfl:    omeprazole (PRILOSEC) 20 MG capsule, Take by mouth. (Patient not taking: Reported on 10/26/2022), Disp: , Rfl:   Review of Systems  Review of Systems  Constitutional: Negative for fever, chills, weight loss, malaise/fatigue and diaphoresis.  HENT: Negative for hearing loss, ear pain, nosebleeds, congestion, sore throat, neck pain, tinnitus and ear discharge.   Eyes: Negative for blurred vision, double vision, photophobia, pain, discharge and redness.  Respiratory: Negative for cough, hemoptysis, sputum production, shortness of breath,  wheezing and stridor.   Cardiovascular: Negative for chest pain, palpitations, orthopnea, claudication, leg swelling and PND.  Gastrointestinal: negative for abdominal pain. Negative for heartburn, nausea, vomiting, diarrhea, constipation, blood in stool and melena.  Genitourinary: Negative for dysuria, urgency, frequency, hematuria and flank pain.  Musculoskeletal: Negative for myalgias, back pain, joint pain and falls.  Skin: Negative for itching and rash.  Neurological: Negative for dizziness, tingling, tremors, sensory change, speech change, focal weakness, seizures, loss of consciousness, weakness and headaches.  Endo/Heme/Allergies: Negative for environmental allergies and polydipsia. Does not bruise/bleed easily.  Psychiatric/Behavioral: Negative for depression, suicidal ideas, hallucinations, memory loss and substance abuse. The patient is not nervous/anxious and does not have insomnia.        Objective:  Blood pressure 137/76, pulse 66, height 5\' 4"  (1.626 m), weight 131 lb (59.4 kg).   Physical Exam  Vitals reviewed. Constitutional: She is oriented to person, place, and time. She appears well-developed and well-nourished.  HENT:  Head: Normocephalic and atraumatic.        Right Ear: External ear normal.  Left Ear: External ear normal.  Nose: Nose normal.  Mouth/Throat: Oropharynx is clear and moist.  Eyes: Conjunctivae and EOM are normal. Pupils are equal, round, and reactive to light. Right eye exhibits no discharge. Left eye exhibits no discharge. No scleral icterus.  Neck: Normal range of motion. Neck supple. No tracheal deviation present. No thyromegaly present.  Cardiovascular: Normal rate, regular rhythm, normal heart sounds and intact distal pulses.  Exam reveals no gallop and no friction rub.   No murmur heard. Respiratory: Effort normal and breath sounds normal. No respiratory distress. She has no wheezes. She has no rales. She exhibits no tenderness.  GI: Soft. Bowel  sounds are normal. She exhibits no distension and no mass. There is no tenderness. There is no rebound and no guarding.  Genitourinary:  Breasts no masses skin changes or nipple changes bilaterally      Vulva is normal without lesions Vagina is pink moist without discharge Cervix normal in appearance and pap is done Uterus is normal size shape and contour Adnexa is negative with normal sized ovaries  {Rectal    hemoccult negative, normal tone, no masses  Musculoskeletal: Normal range of motion. She exhibits no edema and no tenderness.  Neurological: She is alert and oriented to person, place, and time. She has normal reflexes. She displays normal reflexes. No cranial nerve deficit. She exhibits normal muscle tone. Coordination normal.  Skin: Skin is warm and dry. No rash noted. No  erythema. No pallor.  Psychiatric: She has a normal mood and affect. Her behavior is normal. Judgment and thought content normal.       Medications Ordered at today's visit: No orders of the defined types were placed in this encounter.   Other orders placed at today's visit: No orders of the defined types were placed in this encounter.     Assessment:    Normal Gyn exam.    Plan:    Contraception: tubal ligation. Mammogram ordered. Follow up in: 3 years.    Has her mammogram in Dr Dian Situ office-->scheduled for December  Return in about 3 years (around 10/25/2025), or if symptoms worsen or fail to improve, for yearly.

## 2022-11-02 LAB — CYTOLOGY - PAP
Comment: NEGATIVE
Diagnosis: NEGATIVE
High risk HPV: NEGATIVE

## 2023-01-08 DIAGNOSIS — I10 Essential (primary) hypertension: Secondary | ICD-10-CM | POA: Diagnosis not present

## 2023-01-08 DIAGNOSIS — Z8719 Personal history of other diseases of the digestive system: Secondary | ICD-10-CM | POA: Diagnosis not present

## 2023-01-08 DIAGNOSIS — E785 Hyperlipidemia, unspecified: Secondary | ICD-10-CM | POA: Diagnosis not present

## 2023-01-18 ENCOUNTER — Other Ambulatory Visit (HOSPITAL_COMMUNITY): Payer: Self-pay | Admitting: Family Medicine

## 2023-01-18 DIAGNOSIS — M810 Age-related osteoporosis without current pathological fracture: Secondary | ICD-10-CM

## 2023-01-18 DIAGNOSIS — Z0001 Encounter for general adult medical examination with abnormal findings: Secondary | ICD-10-CM | POA: Diagnosis not present

## 2023-01-18 DIAGNOSIS — R030 Elevated blood-pressure reading, without diagnosis of hypertension: Secondary | ICD-10-CM | POA: Diagnosis not present

## 2023-01-18 DIAGNOSIS — Z23 Encounter for immunization: Secondary | ICD-10-CM | POA: Diagnosis not present

## 2023-01-18 DIAGNOSIS — E785 Hyperlipidemia, unspecified: Secondary | ICD-10-CM | POA: Diagnosis not present

## 2023-01-18 DIAGNOSIS — Z1231 Encounter for screening mammogram for malignant neoplasm of breast: Secondary | ICD-10-CM

## 2023-01-18 DIAGNOSIS — Z6821 Body mass index (BMI) 21.0-21.9, adult: Secondary | ICD-10-CM | POA: Diagnosis not present

## 2023-02-01 ENCOUNTER — Ambulatory Visit (HOSPITAL_COMMUNITY)
Admission: RE | Admit: 2023-02-01 | Discharge: 2023-02-01 | Disposition: A | Discharge status: Home / Self Care | Payer: No Typology Code available for payment source | Source: Ambulatory Visit | Attending: Family Medicine | Admitting: Family Medicine

## 2023-02-01 ENCOUNTER — Encounter (HOSPITAL_COMMUNITY): Payer: Self-pay

## 2023-02-01 DIAGNOSIS — Z1231 Encounter for screening mammogram for malignant neoplasm of breast: Secondary | ICD-10-CM | POA: Insufficient documentation

## 2023-02-12 ENCOUNTER — Ambulatory Visit (HOSPITAL_COMMUNITY)
Admission: RE | Admit: 2023-02-12 | Discharge: 2023-02-12 | Disposition: A | Discharge status: Home / Self Care | Payer: No Typology Code available for payment source | Source: Ambulatory Visit | Attending: Family Medicine | Admitting: Family Medicine

## 2023-02-12 DIAGNOSIS — M810 Age-related osteoporosis without current pathological fracture: Secondary | ICD-10-CM | POA: Diagnosis not present

## 2023-02-12 DIAGNOSIS — Z780 Asymptomatic menopausal state: Secondary | ICD-10-CM | POA: Diagnosis not present

## 2023-05-17 DIAGNOSIS — H5203 Hypermetropia, bilateral: Secondary | ICD-10-CM | POA: Diagnosis not present

## 2023-08-24 IMAGING — MG MM DIGITAL SCREENING BILAT W/ TOMO AND CAD
8 series · 9 of 24 positions shown · non-contrast
Comparison: Previous exam(s).

CLINICAL DATA: Screening.

EXAM:
DIGITAL SCREENING BILATERAL MAMMOGRAM WITH TOMOSYNTHESIS AND CAD
TECHNIQUE: Bilateral screening digital craniocaudal and mediolateral oblique
mammograms were obtained. Bilateral screening digital breast
tomosynthesis was performed. The images were evaluated with
computer-aided detection.

[R MLO synth-2D]
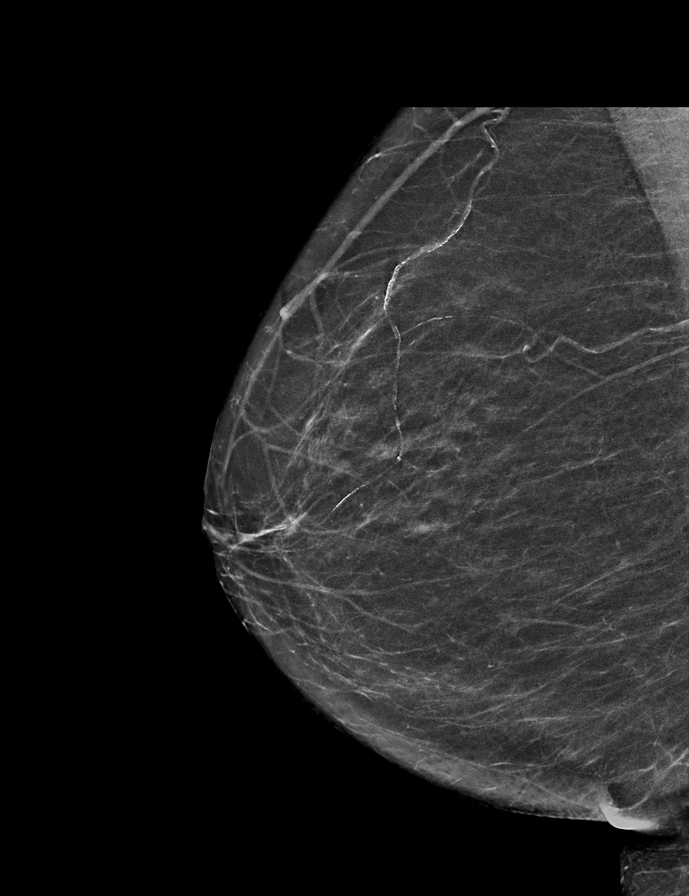

[R CC synth-2D]
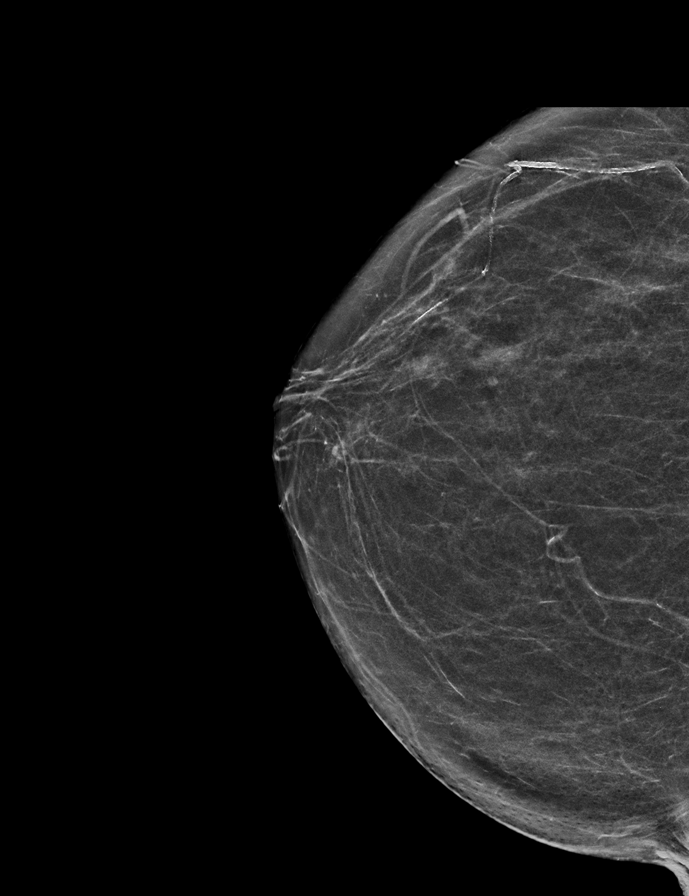

[L MLO synth-2D]
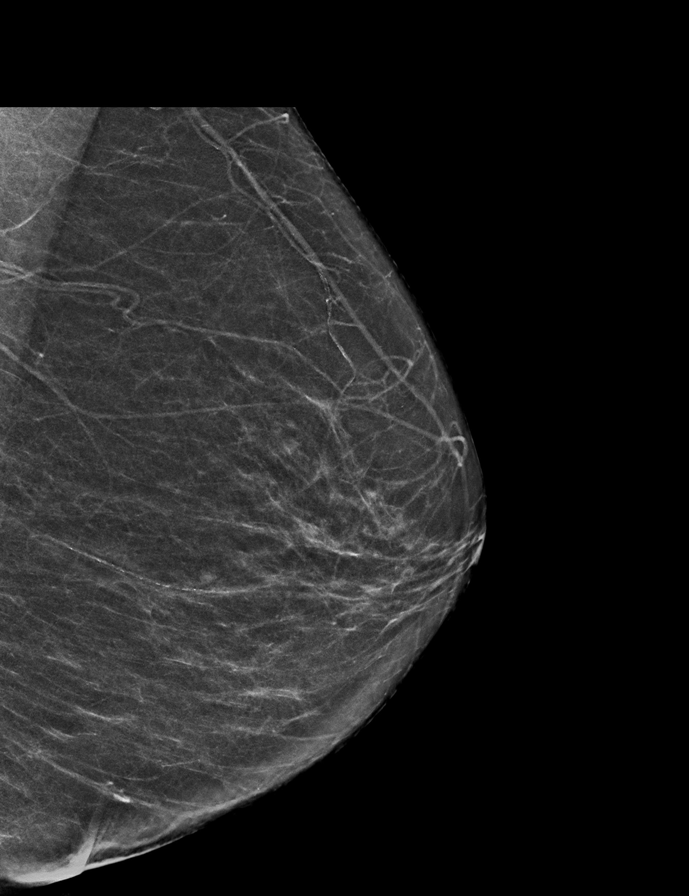

[L CC synth-2D]
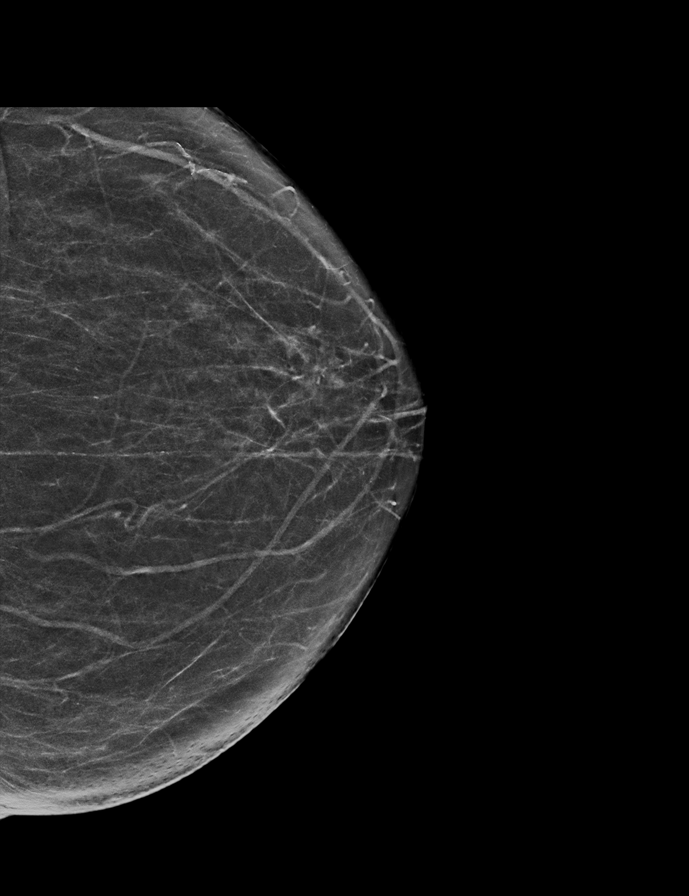

[L MLO tomo · 2 of 58 frames shown]
[frame 19/58]
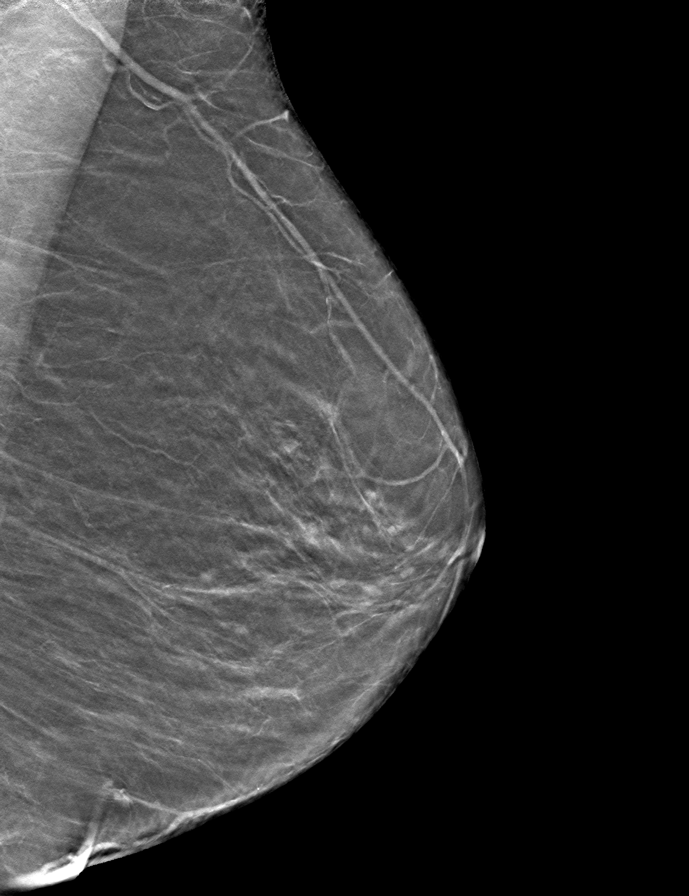
[frame 29/58]
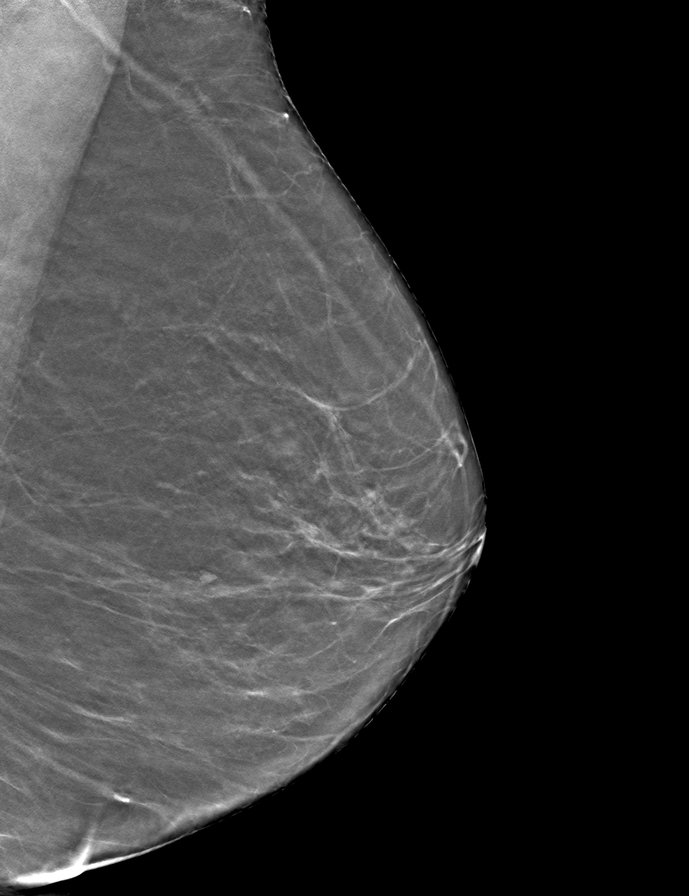

[R CC tomo · tomo slice 28/55.0]
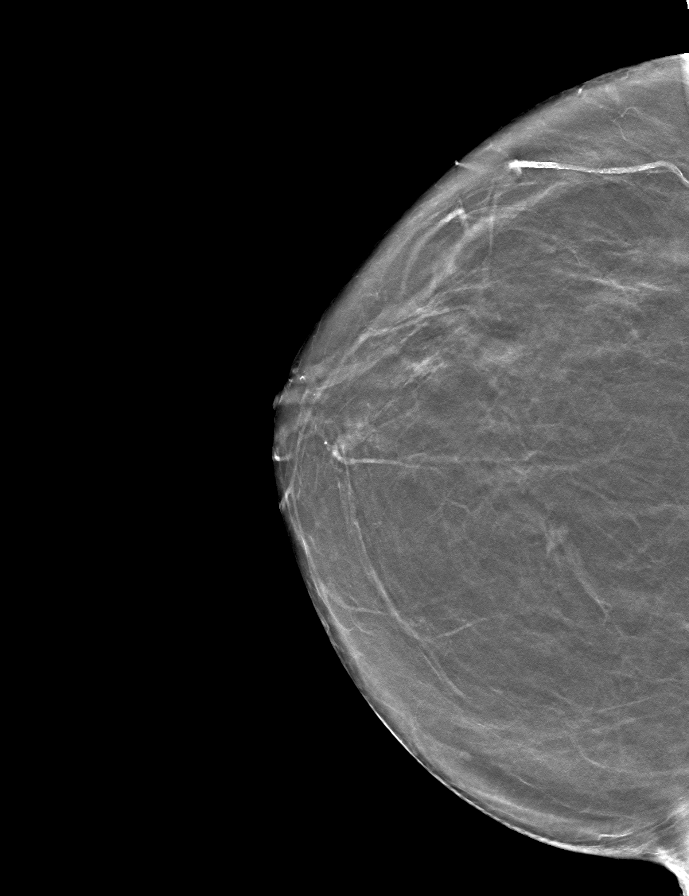

[L CC tomo · tomo slice 31/61.0]
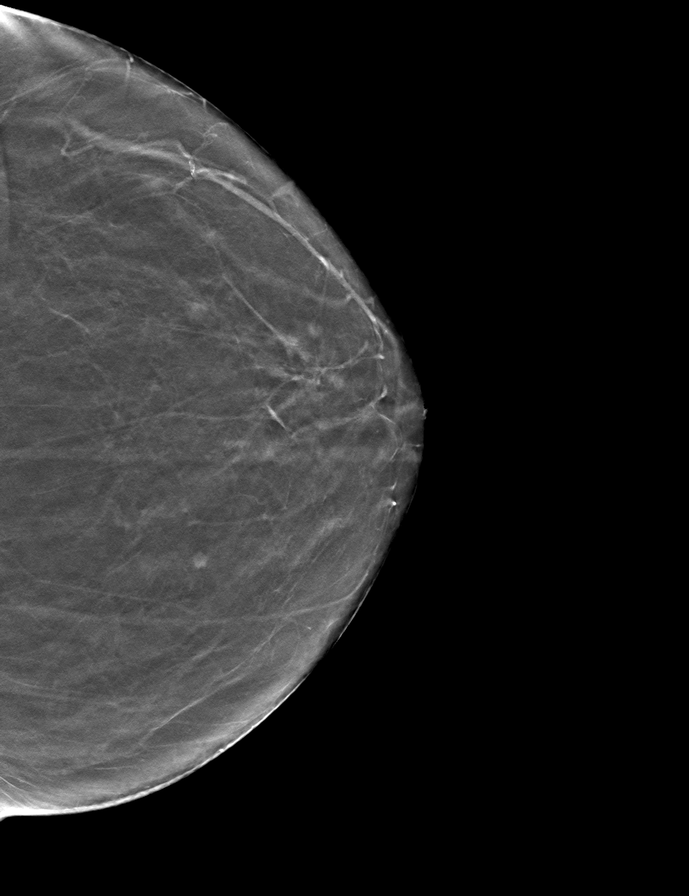

[R MLO tomo · tomo slice 29/57.0]
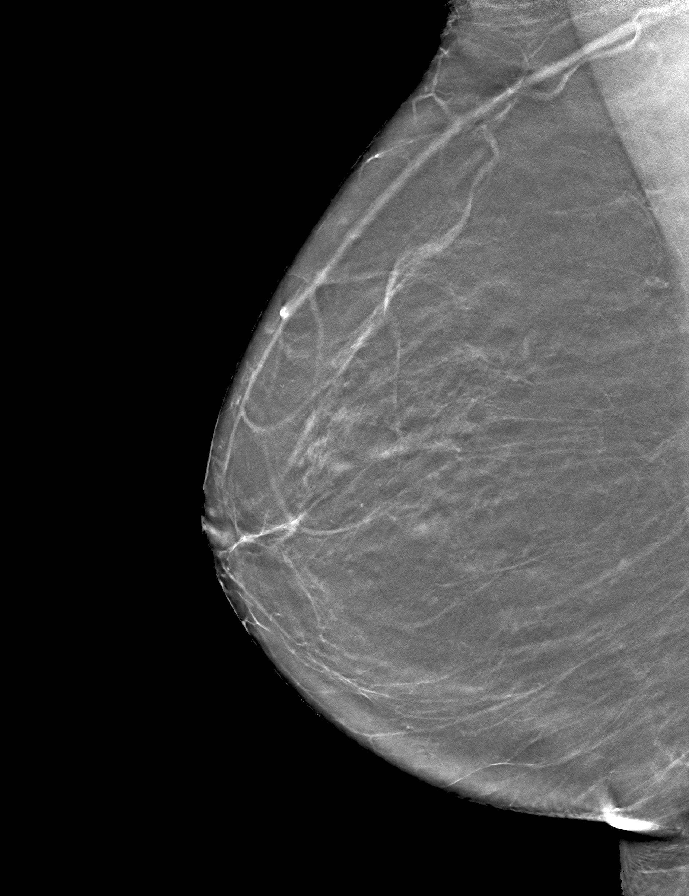

[9 of 24 positions shown; findings below may reference images not displayed]

ACR Breast Density Category b: There are scattered areas of
fibroglandular density.
FINDINGS: There are no findings suspicious for malignancy.
IMPRESSION: No mammographic evidence of malignancy. A result letter of this
screening mammogram will be mailed directly to the patient.

RECOMMENDATION:
Screening mammogram in one year. (Code:51-O-LD2)

BI-RADS CATEGORY  1: Negative.
